# Patient Record
Sex: Female | Born: 2000 | Race: Black or African American | Hispanic: No | Marital: Single
Health system: Southern US, Academic
[De-identification: ages and names within clinical notes are randomized; demographics above are authoritative.]

## PROBLEM LIST (undated history)

## (undated) ENCOUNTER — Telehealth

## (undated) ENCOUNTER — Encounter

## (undated) ENCOUNTER — Ambulatory Visit

## (undated) ENCOUNTER — Ambulatory Visit: Payer: MEDICAID

## (undated) DIAGNOSIS — L309 Dermatitis, unspecified: Secondary | ICD-10-CM

---
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
## 2022-06-14 IMAGING — US US MFM OB DETAIL+14 WK
1 series · 13 of 28 positions shown · non-contrast
Comparison: none

[Series 1: us mfm ob detail+14 wk · 115 acquisitions, 13 frames shown]
[im 5/115]
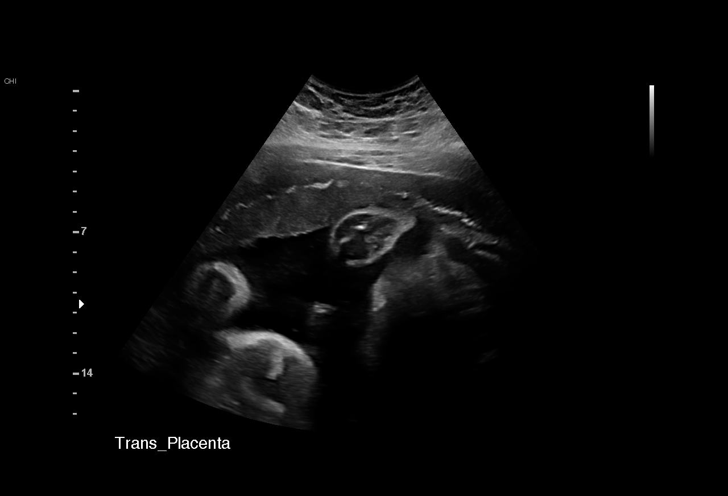
[im 13/115]
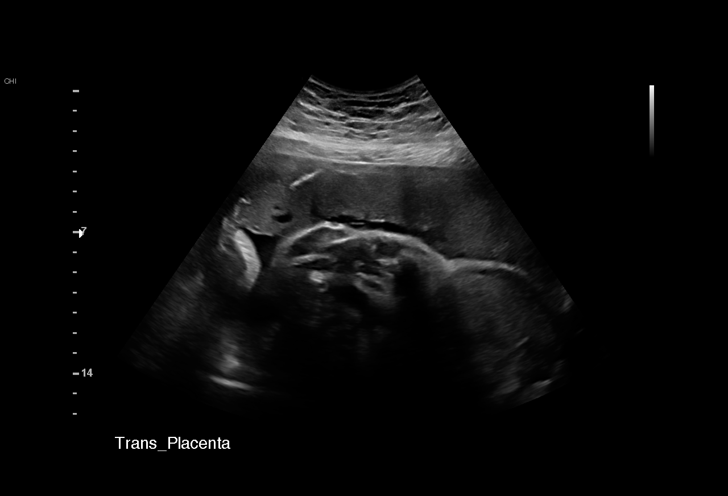
[im 22/115]
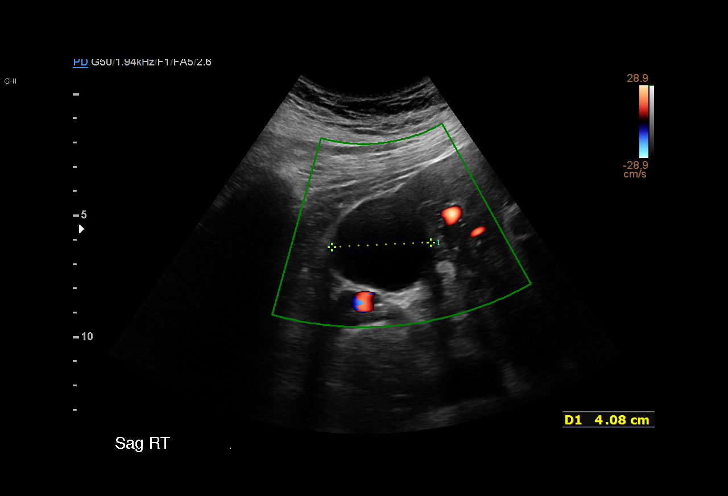
[im 30/115]
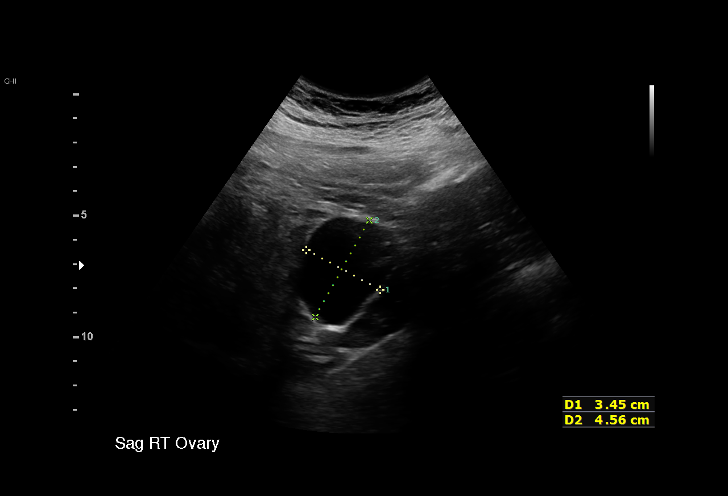
[im 39/115]
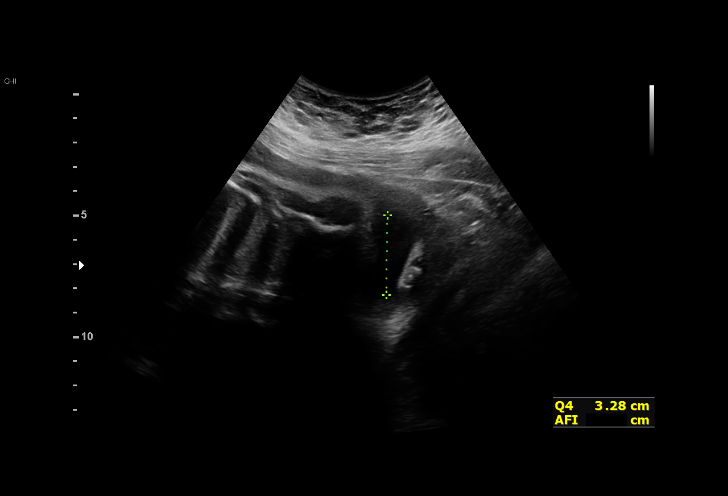
[im 47/115]
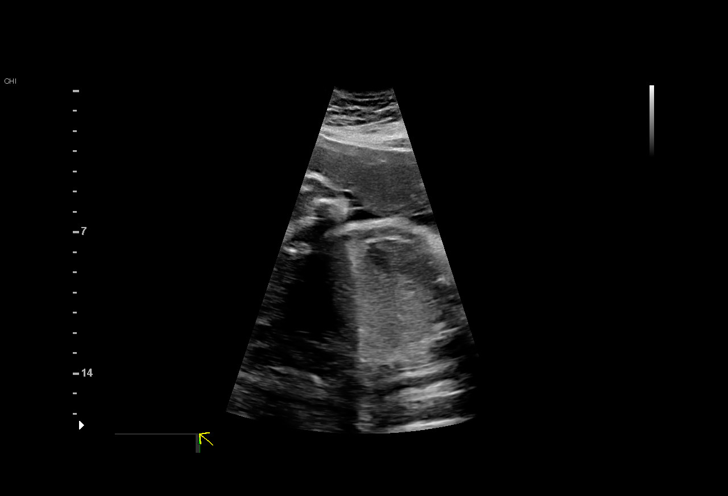
[im 60/115]
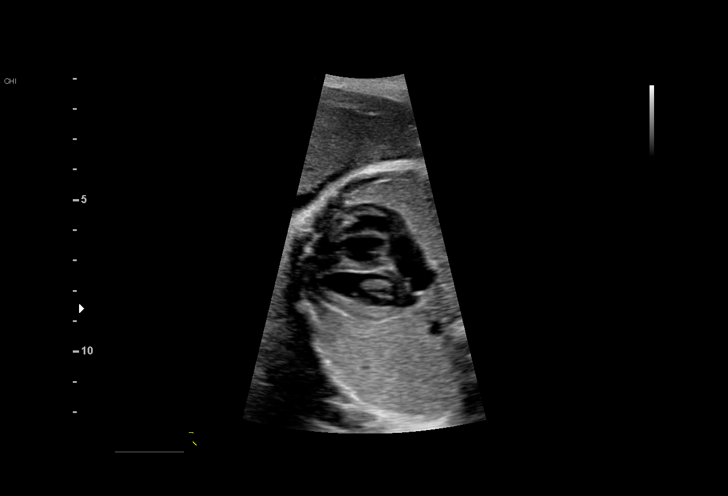
[im 68/115]
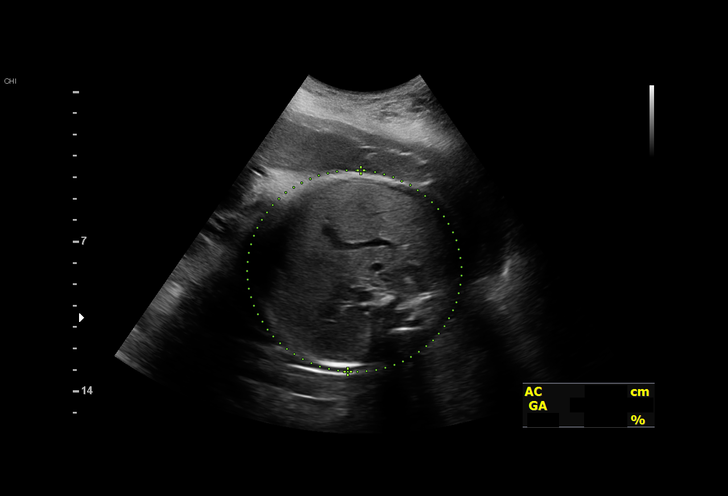
[im 77/115]
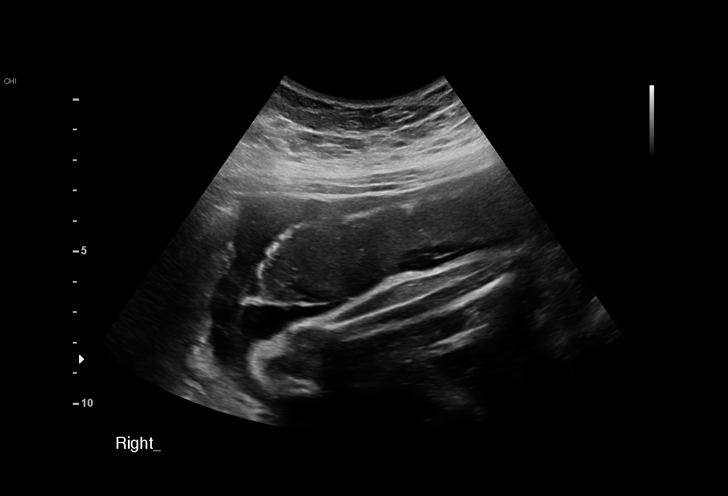
[im 85/115]
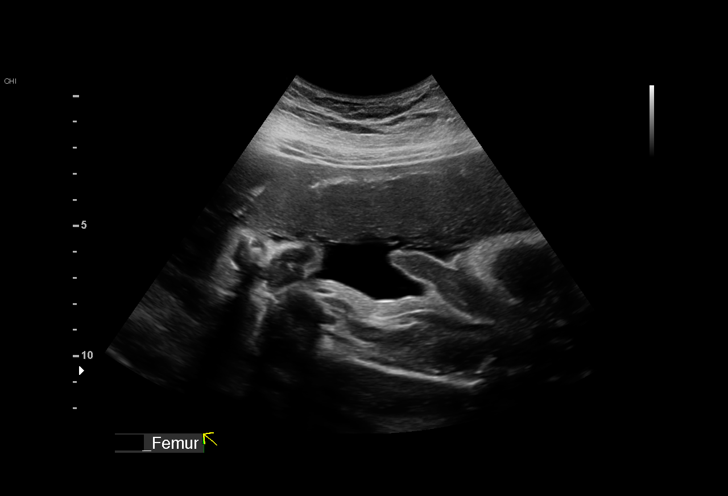
[im 93/115]
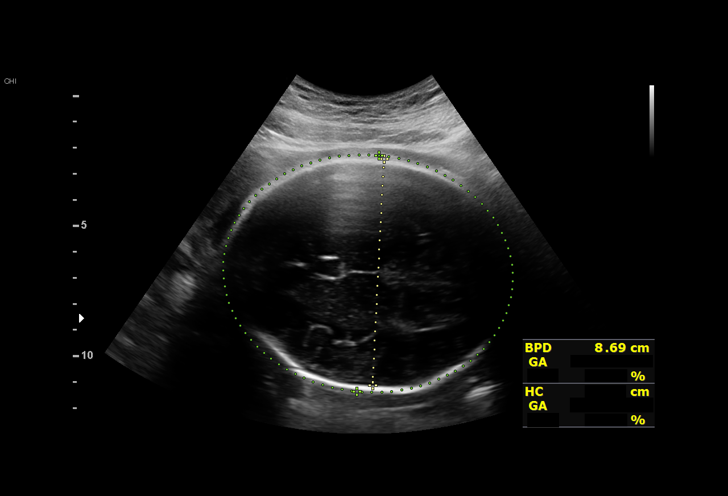
[im 102/115]
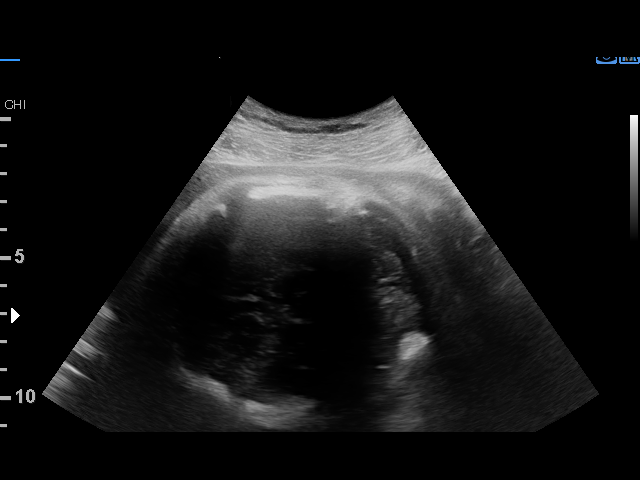
[im 110/115]
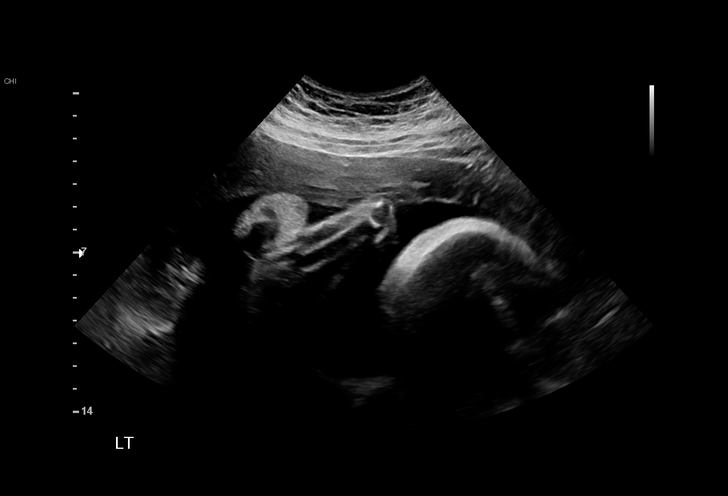

[13 of 28 positions shown; findings below may reference images not displayed]

HMI

 1  US MFM OB DETAIL +14 WK               76811.01    MYRISH MONJE

Indications

 Encounter for uncertain dates
 Encounter for antenatal screening for other
 genetic defects
 Obesity complicating pregnancy, third
 trimester
 34 weeks gestation of pregnancy
Fetal Evaluation

 Num Of Fetuses:         1
 Fetal Heart Rate(bpm):  126
 Cardiac Activity:       Observed
 Presentation:           Cephalic
 Placenta:               Anterior
 P. Cord Insertion:      Not well visualized

 Amniotic Fluid
 AFI FV:      Within normal limits

 AFI Sum(cm)     %Tile       Largest Pocket(cm)
 11.87           33

 RUQ(cm)       RLQ(cm)       LUQ(cm)        LLQ(cm)

Biometry

 BPD:      87.6  mm     G. Age:  35w 3d         79  %    CI:           76   %    70 - 86
                                                         FL/HC:      21.5   %    19.4 -
 HC:      318.5  mm     G. Age:  35w 6d         54  %    HC/AC:      1.03        0.96 -
 AC:      307.8  mm     G. Age:  34w 5d         68  %    FL/BPD:     78.1   %    71 - 87
 FL:       68.4  mm     G. Age:  35w 1d         64  %    FL/AC:      22.2   %    20 - 24
 HUM:      60.9  mm     G. Age:  35w 2d         79  %
 LV:        4.3  mm

 Est. FW:    1371  gm    5 lb 11 oz      67  %
OB History

 Gravidity:    1
Gestational Age

 LMP:           34w 2d        Date:  02/25/20                 EDD:   12/01/20
 U/S Today:     35w 2d                                        EDD:   11/24/20
 Best:          34w 2d     Det. By:  LMP  (02/25/20)          EDD:   12/01/20
Anatomy

 Cranium:               Appears normal         LVOT:                   Appears normal
 Cavum:                 Appears normal         Aortic Arch:            Not well visualized
 Ventricles:            Appears normal         Ductal Arch:            Appears normal
 Choroid Plexus:        Appears normal         Diaphragm:              Appears normal
 Cerebellum:            Not well visualized    Stomach:                Appears normal, left
                                                                       sided
 Posterior Fossa:       Not well visualized    Abdomen:                Appears normal
 Nuchal Fold:           Not applicable (>20    Abdominal Wall:         Appears nml (cord
                        wks GA)                                        insert, abd wall)
 Face:                  Orbits appear          Cord Vessels:           Appears normal (3
                        normal                                         vessel cord)
 Lips:                  Not well visualized    Kidneys:                Appear normal
 Palate:                Not well visualized    Bladder:                Appears normal
 Thoracic:              Appears normal         Spine:                  Appears normal
 Heart:                 Appears normal         Upper Extremities:      Bil. Humerus and
                        (4CH, axis, and                                radius and ulnar
                        situs)
 RVOT:                  Appears normal         Lower Extremities:      Bil. femur and tibia
                                                                       and fibula

 Other:  Technicallly difficult due to advanced GA and maternal habitus. Fetus
         appears to be a male.  Lenses visualized.
Cervix Uterus Adnexa

 Cervix
 Not visualized (advanced GA >19wks)

 Uterus
 No abnormality visualized.

 Right Ovary
 Simple cyst, measuring 5.3cm sagittal x 3.6 cm AP  x 4.1 cm trsv

 Left Ovary
 Within normal limits.
Impression

 G1 P0.  Patient was unaware of her pregnancy until a month
 ago.  She had no prenatal care.

 Fetal biometry is consistent with her sure LMP date.
 Amniotic fluid is normal and good fetal activity seen.  Fetal
 anatomical survey appears normal but limited by advanced
 gestational age.

 I discussed the importance of regular prenatal care from now
 till delivery to prevent adverse outcomes.  Patient has a new
 OB appointment on 11/02/2020 and I encouraged her to have
 an earlier appointment.
Recommendations

 -An appointment was made for her to return in 4 weeks for
 fetal growth assessment.
                 Jim, Fallon

## 2022-08-04 ENCOUNTER — Ambulatory Visit (HOSPITAL_COMMUNITY)
Admission: EM | Admit: 2022-08-04 | Discharge: 2022-08-04 | Disposition: A | Discharge status: Home / Self Care | Payer: Medicaid Other

## 2022-08-07 ENCOUNTER — Encounter (HOSPITAL_COMMUNITY): Payer: Self-pay | Admitting: Emergency Medicine

## 2022-08-07 ENCOUNTER — Ambulatory Visit (HOSPITAL_COMMUNITY)
Admission: EM | Admit: 2022-08-07 | Discharge: 2022-08-07 | Disposition: A | Discharge status: Home / Self Care | Payer: Medicaid Other | Attending: Emergency Medicine | Admitting: Emergency Medicine

## 2022-08-07 DIAGNOSIS — M25561 Pain in right knee: Secondary | ICD-10-CM

## 2022-08-07 MED ORDER — PREDNISONE 20 MG PO TABS: 40.0000 mg | ORAL_TABLET | Freq: Every day | ORAL | 0 refills | Status: AC

## 2022-08-07 MED ORDER — CYCLOBENZAPRINE HCL 10 MG PO TABS: 10.0000 mg | ORAL_TABLET | Freq: Every day | ORAL | 0 refills | Status: AC

## 2022-08-07 NOTE — Discharge Instructions (Signed)
Your pain is most likely caused by irritation to the tendon or ligaments.   Begin prednisone every morning with food for 5 days, this reduces the inflammatory response that occurs with injury which in turn will help with your pain, you may take Tylenol 500 to 1000 mg every 6 hours while using this medication  You may use muscle relaxer at bedtime as needed for additional comfort, be mindful this medication will make you drowsy  You been placed in a knee sleeve here in the office, wear whenever completing activities such as standing or walking to add stability and support  You may use heating pad in 15 minute intervals as needed for additional comfort, or you may find comfort in using ice in 10-15 minutes over affected area  Begin stretching affected area daily for 10 minutes as tolerated to further loosen muscles   When lying down place pillow underneath and between knees for support  Can try sleeping without pillow on firm mattress   If pain persist after recommended treatment or reoccurs if may be beneficial to follow up with orthopedic specialist for evaluation, this doctor specializes in the bones and can manage your symptoms long-term with options such as but not limited to imaging, medications or physical therapy

## 2022-08-07 NOTE — ED Provider Notes (Signed)
[facility]    CSN: [id number] Arrival date & time: 08/07/22  1012      History   Chief Complaint Chief Complaint  Patient presents with   Knee Pain    HPI Sabrina Garner is a 21 y.o. female.   Patient presents with anterior knee pain and swelling for 3 weeks after twisting ankle outwards while she was taking out the trash, denies fall.  Has been painful to bear weight and feels as if it stiffens and locks up when sitting for long periods of time.  Has attempted use of over-the-counter topical cream and ibuprofen which has been minimally helpful.  Denies numbness or tingling, prior injury or trauma.    Past Medical History:  Diagnosis Date   Asthma    Eczema     Patient Active Problem List   Diagnosis Date Noted   Nexplanon in place 12/29/2020   Vaginal delivery 11/14/2020   Urinary tract infection in mother during third trimester of pregnancy 11/01/2020   Gonorrhea affecting pregnancy in third trimester 10/28/2020   Chlamydia infection affecting pregnancy in third trimester 10/28/2020   Supervision of low-risk first pregnancy 10/26/2020   Late prenatal care affecting pregnancy in third trimester 10/26/2020   Abnormal thyroid function test 03/10/2019   Obesity 03/10/2019   Venous stasis dermatitis 03/09/2019   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 No pertinent surgical history.  OB History     Gravida  1   Para  1   Term  1   Preterm      AB      Living  1      SAB      IAB      Ectopic      Multiple  0   Live Births  1            Home Medications    Prior to Admission medications   Medication Sig Start Date End Date Taking? Authorizing Provider  albuterol (VENTOLIN HFA) 108 (90 Base) MCG/ACT inhaler Inhale 2 puffs into the lungs every 4 (four) hours as needed for wheezing. Give spacer. For coughing or wheezing 07/04/21   Lynden Oxford Scales, PA-C   ferrous sulfate 325 (65 FE) MG EC tablet Take 1 tablet (325 mg total) by mouth every other day. Patient not taking: Reported on 12/29/2020 11/16/20 11/16/21  Lenoria Chime, MD  methylPREDNISolone (MEDROL DOSEPAK) 4 MG TBPK tablet Take 24 mg on day 1, 20 mg on day 2, 16 mg on day 3, 12 mg on day 4, 8 mg on day 5, 4 mg on day 6. 07/04/21   Lynden Oxford Scales, PA-C  triamcinolone ointment (KENALOG) 0.1 % Apply 1 application topically 2 (two) times daily. 07/04/21   Lynden Oxford Scales, PA-C  clobetasol (TEMOVATE) 0.05 % external solution Apply topically at bedtime as needed. Apply small amount to dry scaly patches on scalp for 2 weeks, then as needed - disp 1 bottle   09/26/11  [provider]    Family History Family History  Problem Relation Age of Onset   Arthritis Mother    Diabetes Father    Cancer Paternal Aunt     Social History Social History   Tobacco Use   Smoking status: Former    Types: Cigars   Smokeless tobacco: Never   Tobacco comments:    once or twice a week before pregnancy  Vaping Use  Vaping Use: Former   Substances: Flavoring  Substance Use Topics   Alcohol use: Never   Drug use: Never     Allergies   Tomato   Review of Systems Review of Systems  Constitutional: Negative.   Respiratory: Negative.    Cardiovascular: Negative.   Musculoskeletal:  Positive for gait problem and joint swelling. Negative for arthralgias, back pain, myalgias, neck pain and neck stiffness.  Skin: Negative.      Physical Exam Triage Vital Signs ED Triage Vitals  Enc Vitals Group     BP 08/07/22 1056 130/84     Pulse Rate 08/07/22 1056 70     Resp 08/07/22 1056 18     Temp 08/07/22 1056 98.3 F (36.8 C)     Temp src --      SpO2 08/07/22 1056 97 %     Weight --      Height --      Head Circumference --      Peak Flow --      Pain Score 08/07/22 1058 8     Pain Loc --      Pain Edu? --      Excl. in GC? --    No data found.  Updated Vital  Signs BP 130/84 (BP Location: Right Arm)   Pulse 70   Temp 98.3 F (36.8 C)   Resp 18   SpO2 97%   Visual Acuity Right Eye Distance:   Left Eye Distance:   Bilateral Distance:    Right Eye Near:   Left Eye Near:    Bilateral Near:     Physical Exam Constitutional:      Appearance: Normal appearance.  HENT:     Head: Normocephalic.  Eyes:     Extraocular Movements: Extraocular movements intact.  Pulmonary:     Effort: Pulmonary effort is normal.  Musculoskeletal:     Comments: Tenderness and mild to moderate swelling is present along the medial aspect of the anterior right knee, no ecchymosis or deformity present, able to bear weight, able to extend and flex at the knee, 2+ popliteal pulse  Neurological:     Mental Status: She is alert and oriented to person, place, and time. Mental status is at baseline.  Psychiatric:        Mood and Affect: Mood normal.        Behavior: Behavior normal.      UC Treatments / Results  Labs (all labs ordered are listed, but only abnormal results are displayed) Labs Reviewed - No data to display  EKG   Radiology No results found.  Procedures Procedures (including critical care time)  Medications Ordered in UC Medications - No data to display  Initial Impression / Assessment and Plan / UC Course  I have reviewed the triage vital signs and the nursing notes.  Pertinent labs & imaging results that were available during my care of the patient were reviewed by me and considered in my medical decision making (see chart for details).  Acute right knee pain  Low suspicion of bone involvement due to lack of direct injury, therefore imaging deferred, discussed with patient, most likely a injury to a tendon or ligament we will move forward with conservative management as NSAIDs have been ineffective, prednisone and Flexeril prescribed, knee sleeve applied in office by nursing staff, advised to be used when completing activity, may  remove at rest, recommended ice and heat, massage, stretching and activity as tolerated for additional supportive measures  and given referral to orthopedics if symptoms persist or worsen Final Clinical Impressions(s) / UC Diagnoses   Final diagnoses:  None   Discharge Instructions   None    ED Prescriptions   None    PDMP not reviewed this encounter.   Hans Eden, NP 08/07/22 1113

## 2022-08-07 NOTE — ED Triage Notes (Signed)
Pt reports right knee pain x 3 weeks. States she twisted her knee and that pain has gotten worse since then. Denies falling
